# Patient Record
Sex: Male | Born: 1995 | Race: Black or African American | Hispanic: No | Marital: Single | Smoking: Never smoker
Health system: Southern US, Community
[De-identification: ages and names within clinical notes are randomized; demographics above are authoritative.]

## PROBLEM LIST (undated history)

## (undated) DIAGNOSIS — L0291 Cutaneous abscess, unspecified: Secondary | ICD-10-CM

## (undated) HISTORY — PX: EYE SURGERY: SHX253

## (undated) HISTORY — PX: TESTICULAR EXPLORATION: SHX5145

---
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
## 2016-02-29 ENCOUNTER — Encounter (HOSPITAL_COMMUNITY): Payer: Self-pay

## 2016-02-29 ENCOUNTER — Ambulatory Visit (INDEPENDENT_AMBULATORY_CARE_PROVIDER_SITE_OTHER): Payer: BLUE CROSS/BLUE SHIELD

## 2016-02-29 ENCOUNTER — Ambulatory Visit (HOSPITAL_COMMUNITY)
Admission: EM | Admit: 2016-02-29 | Discharge: 2016-02-29 | Disposition: A | Discharge status: Home / Self Care | Payer: BLUE CROSS/BLUE SHIELD | Attending: Emergency Medicine | Admitting: Emergency Medicine

## 2016-02-29 DIAGNOSIS — S82832A Other fracture of upper and lower end of left fibula, initial encounter for closed fracture: Secondary | ICD-10-CM

## 2016-02-29 DIAGNOSIS — S82492A Other fracture of shaft of left fibula, initial encounter for closed fracture: Secondary | ICD-10-CM

## 2016-02-29 NOTE — ED Notes (Signed)
Patient presents with swollen left ankle, he states he was in basketball practice and fell on a teammate and felt his ankle pop, incident took place today. Pt applied ice to injury site. No acute distress

## 2016-02-29 NOTE — Discharge Instructions (Signed)
You have a broken fibula. This is the bone on the outside of your ankle. These tend to heal well in 4-6 weeks. Wear the Cam Walker boot if you are moving around.  No weightbearing. Use the crutches until you see Dr. Ophelia CharterYates. If you are sitting, you can take the boot off to apply ice. Take Tylenol or ibuprofen as needed for pain. Call Dr. Ophelia CharterYates' office Monday morning for an appointment sometime next week.

## 2016-02-29 NOTE — ED Provider Notes (Signed)
CSN: [id number]     Arrival date & time 02/29/16  1325 History   First MD Initiated Contact with Patient 02/29/16 1542     Chief Complaint  Patient presents with  . Ankle Pain   (Consider location/radiation/quality/duration/timing/severity/associated sxs/prior Treatment) HPI  He is an 20 year old man here for evaluation of left ankle injury. He was playing basketball earlier today when he fell on his left ankle. He thinks he rolled it, but he definitely heard and felt a pop in the ankle. He had immediate swelling and pain. He has been unable to bear weight on it. He denies any pain in the foot or farther up the leg. No prior injury to the ankle.  He has applied ice.  Past Medical History  Diagnosis Date  . Abscess    Past Surgical History  Procedure Laterality Date  . Testicular exploration    . Eye surgery     No family history on file. Social History  Substance Use Topics  . Smoking status: Never Smoker   . Smokeless tobacco: Never Used  . Alcohol Use: No    Review of Systems As in history of present illness Allergies  Review of patient's allergies indicates no known allergies.  Home Medications   Prior to Admission medications   Not on File   Meds Ordered and Administered this Visit  Medications - No data to display  BP 110/64 mmHg  Pulse 64  Temp(Src) 98.9 F (37.2 C) (Oral)  Resp 18  SpO2 100% No data found.   Physical Exam  Constitutional: He is oriented to person, place, and time. He appears well-developed and well-nourished. No distress.  Cardiovascular: Normal rate.   Pulmonary/Chest: Effort normal.  Musculoskeletal:  Left ankle: There is significant swelling around the lateral malleolus. No obvious bruising. He is able to plantar flex and dorsiflex his foot, but with pain. He is tender over both the medial and lateral malleoli. No fifth metatarsal tenderness. 2+ DP pulse.  Neurological: He is alert and oriented to person, place, and time.    ED  Course  Procedures (including critical care time)  Labs Review Labs Reviewed - No data to display  Imaging Review Dg Ankle Complete Left  02/29/2016  CLINICAL DATA:  Left ankle popping sensation and pain after a jumping injury, decreased range of motion and cannot bear weight. Initial encounter. EXAM: LEFT ANKLE COMPLETE - 3+ VIEW COMPARISON:  None. FINDINGS: Marked soft tissue swelling over the lateral malleolus. Nondisplaced of obliquely oriented fracture of the distal fibular metaphysis. Fracture line likely extends to the physis. Ankle mortise is intact. Associated joint effusion. IMPRESSION: Nondisplaced distal fibular metaphyseal fracture, likely extending to the physis. Marked overlying soft tissue swelling and associated joint effusion. Electronically Signed   By: Leanna BattlesMelinda  Blietz M.D.   On: 02/29/2016 16:13     MDM   1. Fracture of distal end of left fibula    Spoke with Dr. Ophelia CharterYates in orthopedic surgery. Will place in Cam Walker boot and nonweightbearing with crutches. Recommended elevation, ice, Tylenol/ibuprofen. Follow-up with Dr. Ophelia CharterYates next week.    Charm RingsErin J Honig, MD 02/29/16 [phone]

## 2016-03-05 ENCOUNTER — Other Ambulatory Visit: Payer: Self-pay | Admitting: Orthopaedic Surgery

## 2016-03-05 DIAGNOSIS — M25572 Pain in left ankle and joints of left foot: Secondary | ICD-10-CM

## 2016-03-21 ENCOUNTER — Inpatient Hospital Stay: Admission: RE | Admit: 2016-03-21 | Payer: BLUE CROSS/BLUE SHIELD | Source: Ambulatory Visit

## 2016-09-14 IMAGING — DX DG ANKLE COMPLETE 3+V*L*
3 series · 3 of 3 positions shown · non-contrast
Comparison: None.

CLINICAL DATA: Left ankle popping sensation and pain after a
jumping injury, decreased range of motion and cannot bear weight.
Initial encounter.

EXAM:
LEFT ANKLE COMPLETE - 3+ VIEW

[ankle ap]
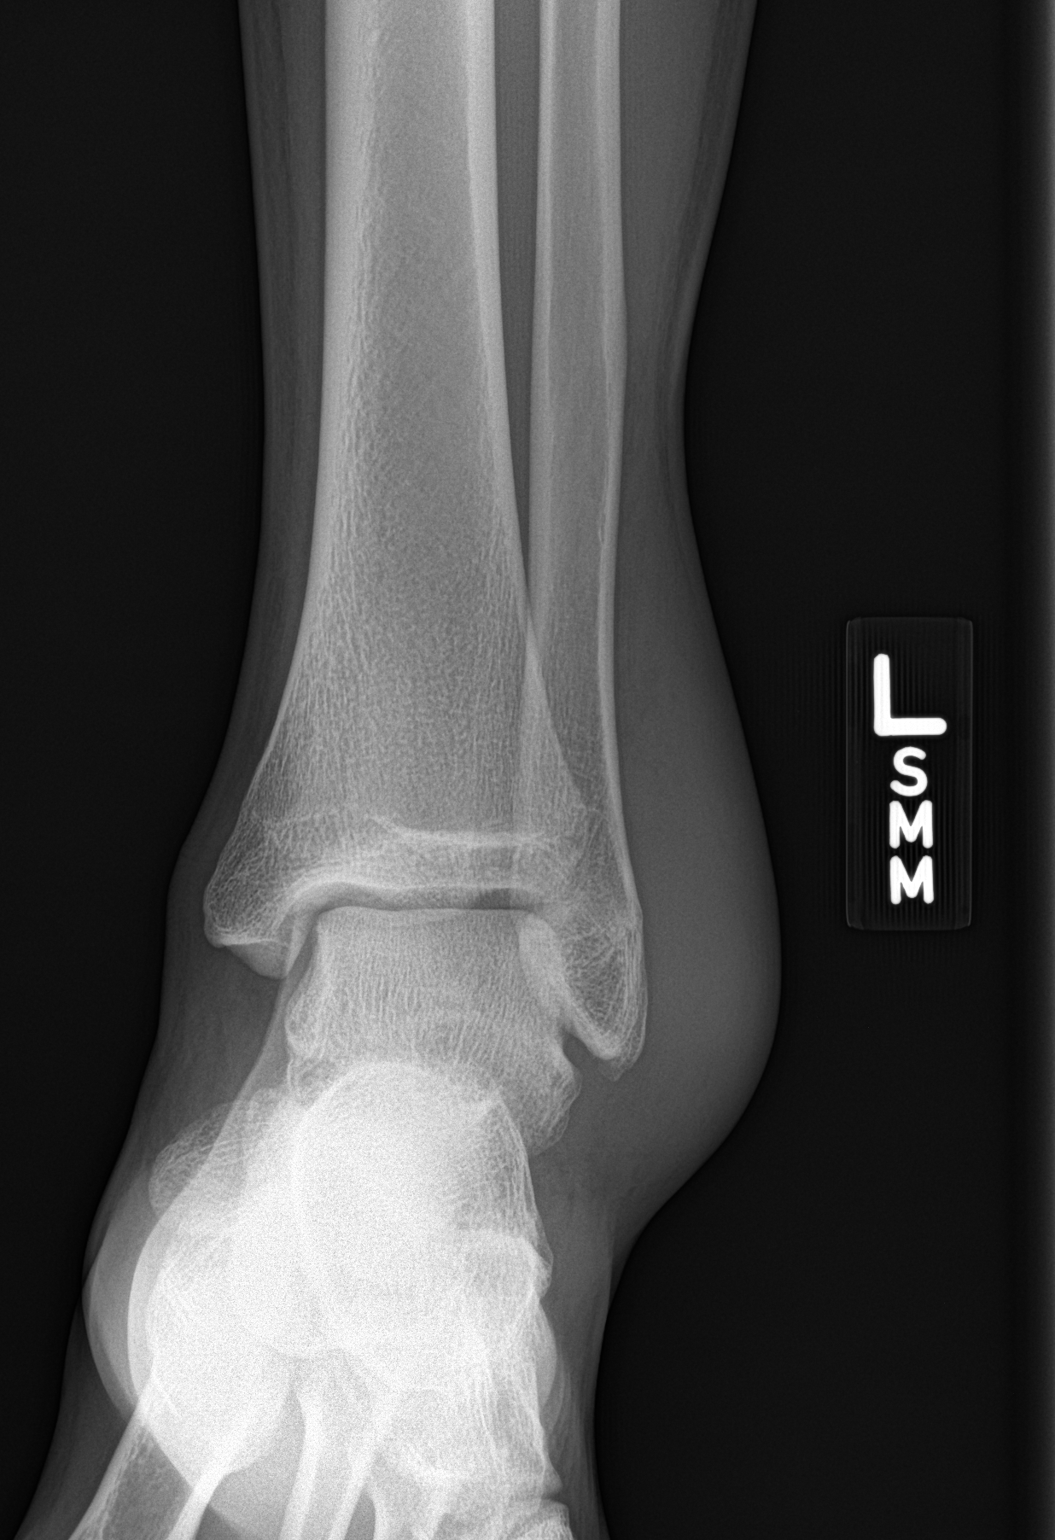

[ankle obl]
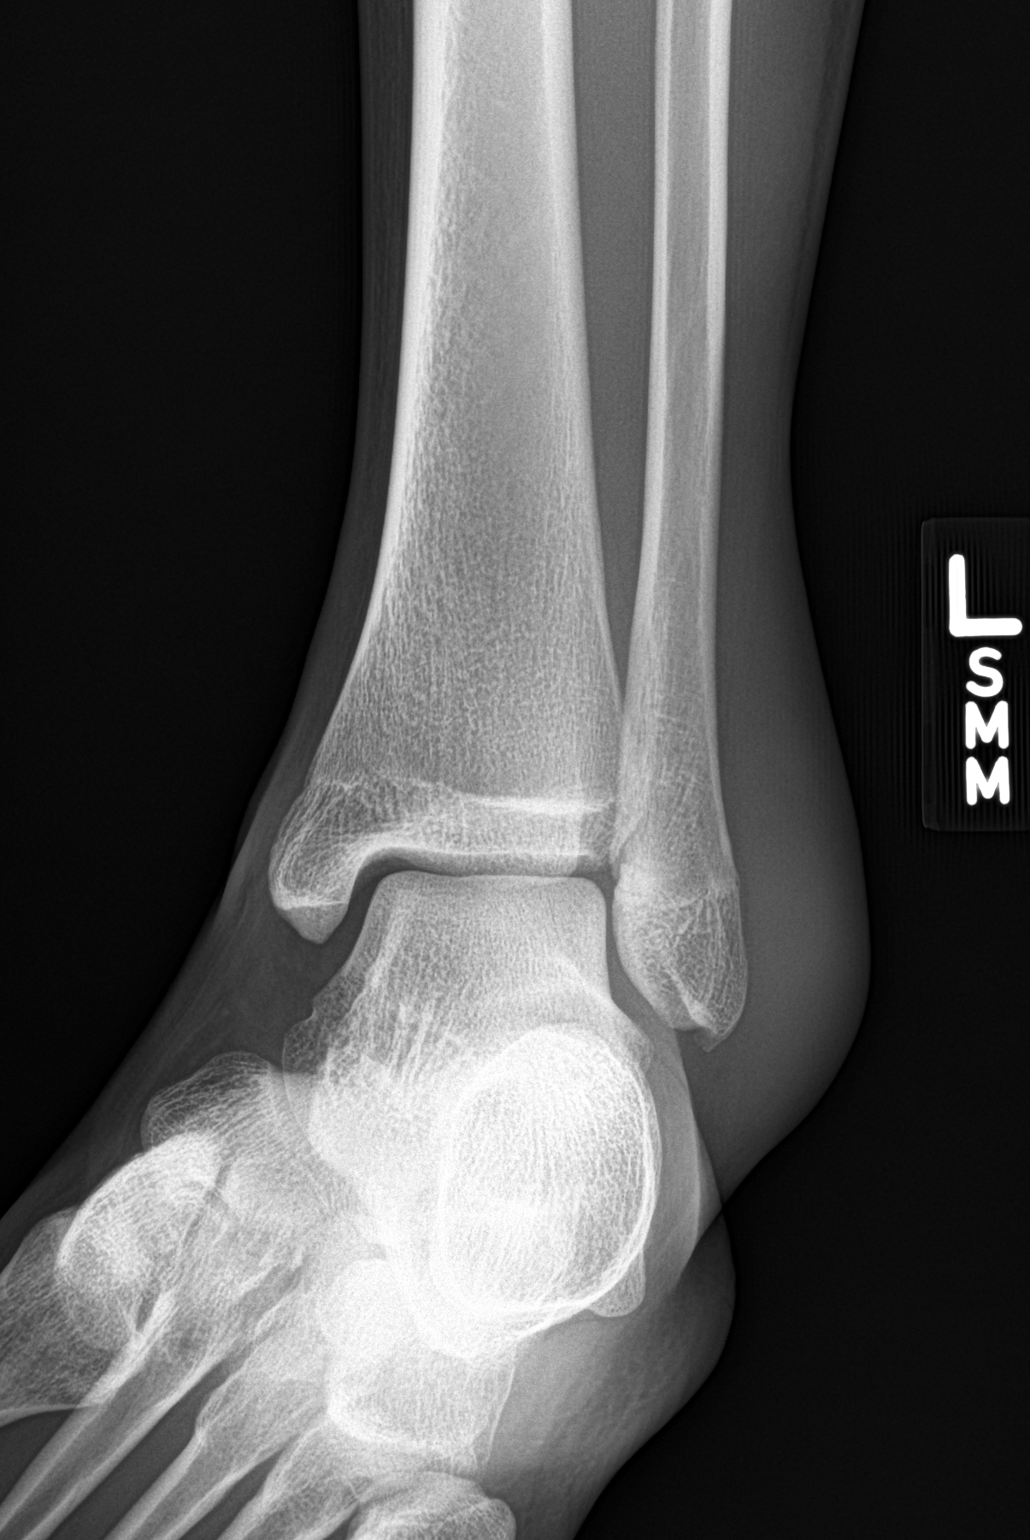

[ankle lat]
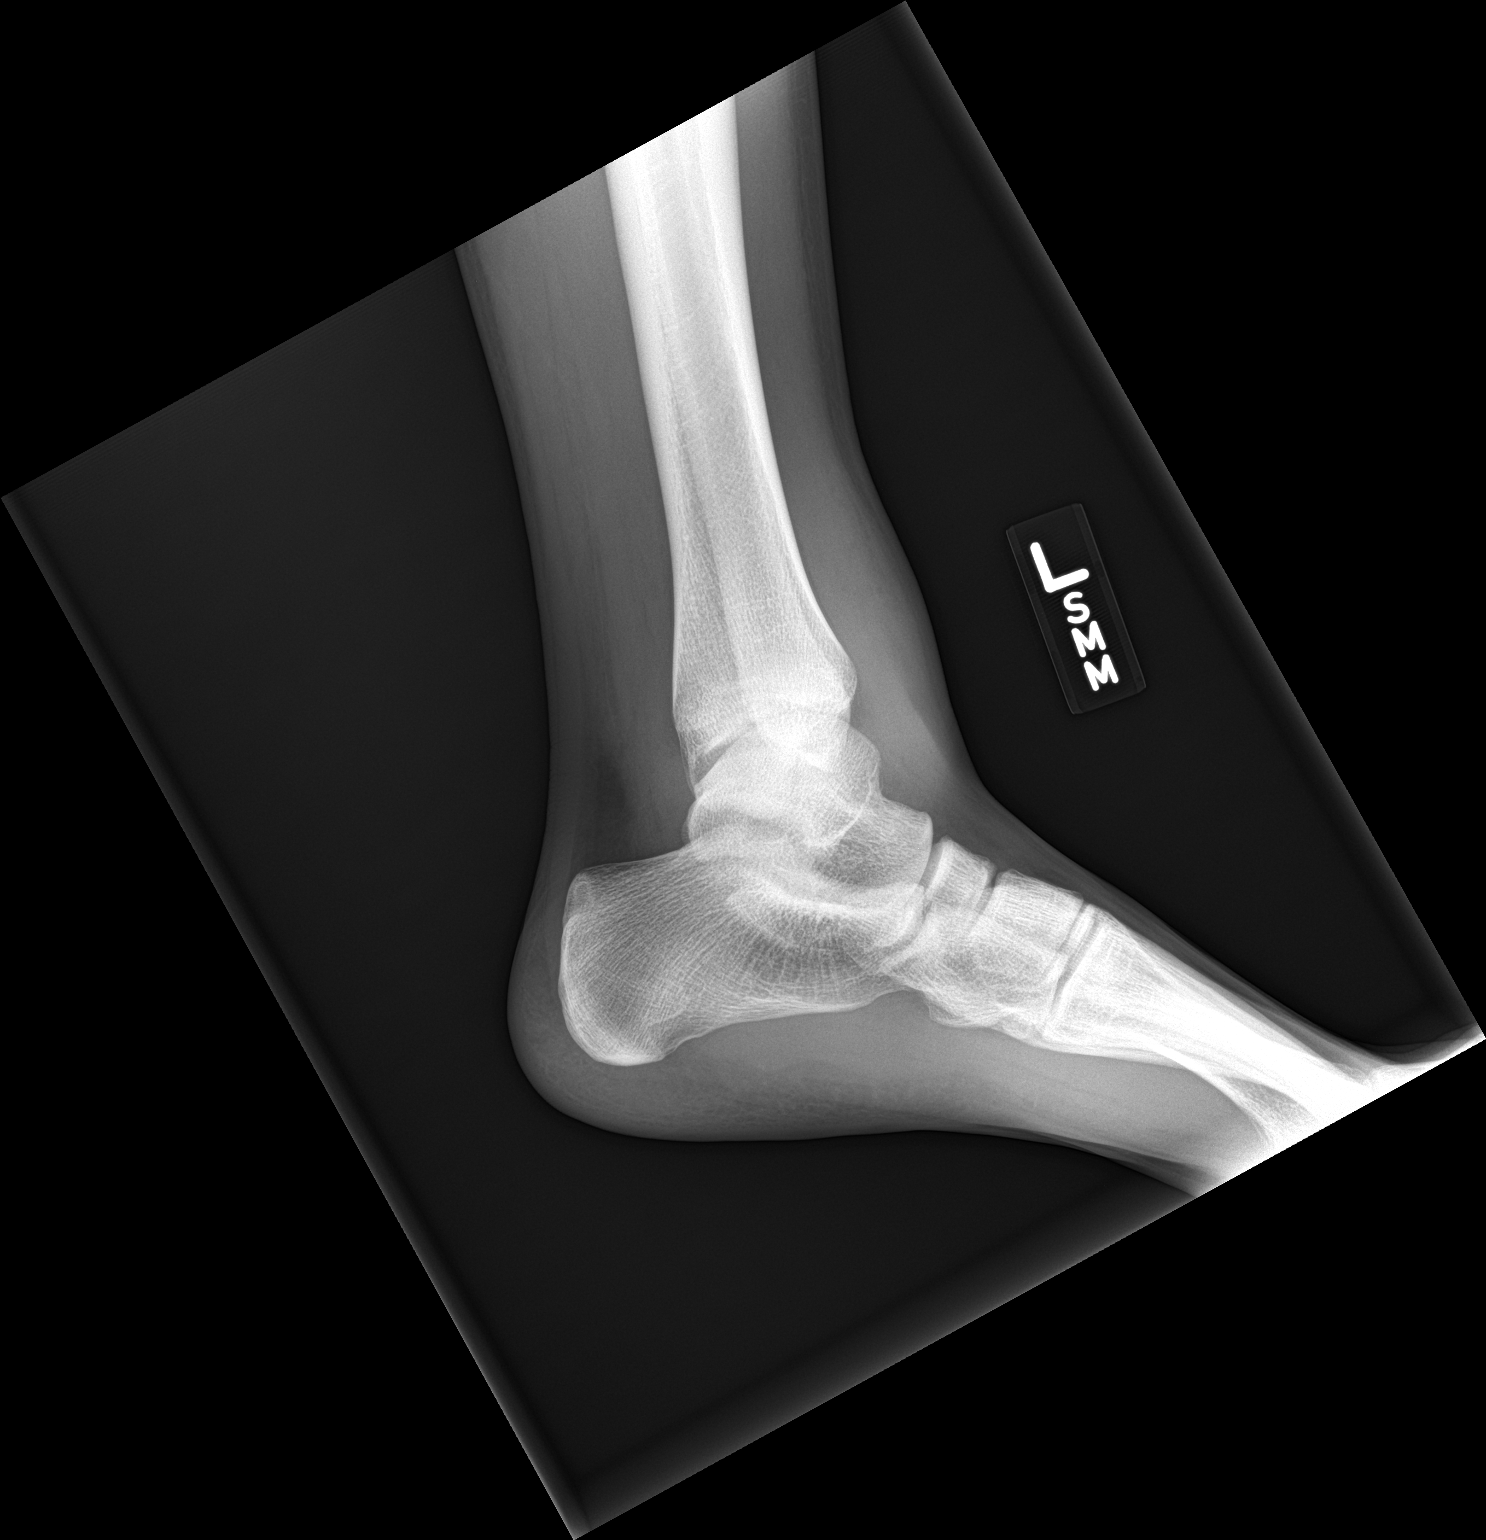

[3 of 3 positions shown; findings below may reference images not displayed]

FINDINGS: Marked soft tissue swelling over the lateral malleolus. Nondisplaced
of obliquely oriented fracture of the distal fibular metaphysis.
Fracture line likely extends to the physis. Ankle mortise is intact.
Associated joint effusion.
IMPRESSION: Nondisplaced distal fibular metaphyseal fracture, likely extending
to the physis. Marked overlying soft tissue swelling and associated
joint effusion.
